# Patient Record
Sex: Female | Born: 1970 | Race: White | Hispanic: No | Marital: Married | Smoking: Never smoker
Health system: Southern US, Community
[De-identification: ages and names within clinical notes are randomized; demographics above are authoritative.]

---

## 1999-11-04 ENCOUNTER — Other Ambulatory Visit: Admission: RE | Admit: 1999-11-04 | Discharge: 1999-11-04 | Payer: Self-pay | Admitting: Obstetrics and Gynecology

## 2000-03-24 ENCOUNTER — Inpatient Hospital Stay (HOSPITAL_COMMUNITY): Admission: AD | Admit: 2000-03-24 | Discharge: 2000-03-24 | Payer: Self-pay | Admitting: Obstetrics & Gynecology

## 2000-04-26 ENCOUNTER — Encounter: Payer: Self-pay | Admitting: Obstetrics and Gynecology

## 2000-04-26 ENCOUNTER — Ambulatory Visit (HOSPITAL_COMMUNITY): Admission: RE | Admit: 2000-04-26 | Discharge: 2000-04-26 | Payer: Self-pay | Admitting: Obstetrics and Gynecology

## 2000-05-12 ENCOUNTER — Inpatient Hospital Stay (HOSPITAL_COMMUNITY): Admission: AD | Admit: 2000-05-12 | Discharge: 2000-05-14 | Payer: Self-pay | Admitting: Obstetrics and Gynecology

## 2000-06-14 ENCOUNTER — Other Ambulatory Visit: Admission: RE | Admit: 2000-06-14 | Discharge: 2000-06-14 | Payer: Self-pay | Admitting: Obstetrics and Gynecology

## 2003-11-04 ENCOUNTER — Other Ambulatory Visit: Admission: RE | Admit: 2003-11-04 | Discharge: 2003-11-04 | Payer: Self-pay | Admitting: Obstetrics and Gynecology

## 2004-11-08 ENCOUNTER — Other Ambulatory Visit: Admission: RE | Admit: 2004-11-08 | Discharge: 2004-11-08 | Payer: Self-pay | Admitting: Obstetrics and Gynecology

## 2005-12-02 ENCOUNTER — Other Ambulatory Visit: Admission: RE | Admit: 2005-12-02 | Discharge: 2005-12-02 | Payer: Self-pay | Admitting: Obstetrics and Gynecology

## 2007-01-09 ENCOUNTER — Other Ambulatory Visit: Admission: RE | Admit: 2007-01-09 | Discharge: 2007-01-09 | Payer: Self-pay | Admitting: Obstetrics and Gynecology

## 2008-04-07 ENCOUNTER — Other Ambulatory Visit: Admission: RE | Admit: 2008-04-07 | Discharge: 2008-04-07 | Payer: Self-pay | Admitting: Obstetrics and Gynecology

## 2009-04-08 ENCOUNTER — Other Ambulatory Visit: Admission: RE | Admit: 2009-04-08 | Discharge: 2009-04-08 | Payer: Self-pay | Admitting: Obstetrics and Gynecology

## 2013-12-22 ENCOUNTER — Ambulatory Visit (INDEPENDENT_AMBULATORY_CARE_PROVIDER_SITE_OTHER): Payer: BC Managed Care – PPO | Admitting: Internal Medicine

## 2013-12-22 VITALS — BP 114/68 | HR 73 | Temp 98.0°F | Resp 15 | Ht 65.5 in | Wt 131.5 lb

## 2013-12-22 DIAGNOSIS — B850 Pediculosis due to Pediculus humanus capitis: Secondary | ICD-10-CM

## 2013-12-22 MED ORDER — IVERMECTIN 3 MG PO TABS: 400.0000 ug/kg | ORAL_TABLET | Freq: Once | ORAL | Status: AC

## 2013-12-23 NOTE — Progress Notes (Signed)
Here with daughter and son who is also like her been through to topical treatments for head lice this summer with remission but then relapse. They have a third relapse over the last 2 days. 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 ivermectin (STROMECTOL) 3 MG TABS tablet    Sig: Take 8 tablets (24,000 mcg total) by mouth once. Repeat in 7 days    Dispense:  16 tablet    Refill:  0   She is given a handout regarding side effects which she can share with her family Husband is bald and does not need treatment

## 2014-05-30 ENCOUNTER — Ambulatory Visit
Admission: RE | Admit: 2014-05-30 | Discharge: 2014-05-30 | Disposition: A | Discharge status: Home / Self Care | Payer: BC Managed Care – PPO | Source: Ambulatory Visit | Attending: Family Medicine | Admitting: Family Medicine

## 2014-05-30 ENCOUNTER — Other Ambulatory Visit: Payer: Self-pay | Admitting: Family Medicine

## 2014-05-30 DIAGNOSIS — M545 Low back pain: Secondary | ICD-10-CM

## 2017-09-19 ENCOUNTER — Other Ambulatory Visit: Payer: Self-pay | Admitting: Family Medicine

## 2017-09-19 DIAGNOSIS — M5416 Radiculopathy, lumbar region: Secondary | ICD-10-CM

## 2017-09-20 ENCOUNTER — Other Ambulatory Visit: Payer: BC Managed Care – PPO

## 2017-09-21 ENCOUNTER — Ambulatory Visit
Admission: RE | Admit: 2017-09-21 | Discharge: 2017-09-21 | Disposition: A | Discharge status: Home / Self Care | Payer: BC Managed Care – PPO | Source: Ambulatory Visit | Attending: Family Medicine | Admitting: Family Medicine

## 2017-09-21 ENCOUNTER — Other Ambulatory Visit: Payer: Self-pay | Admitting: Family Medicine

## 2017-09-21 DIAGNOSIS — M5416 Radiculopathy, lumbar region: Secondary | ICD-10-CM

## 2017-10-19 ENCOUNTER — Encounter: Payer: Self-pay | Admitting: Family Medicine

## 2017-10-19 ENCOUNTER — Ambulatory Visit: Payer: BC Managed Care – PPO | Admitting: Family Medicine

## 2017-10-19 VITALS — BP 110/84 | HR 83 | Ht 65.5 in | Wt 131.0 lb

## 2017-10-19 DIAGNOSIS — G5702 Lesion of sciatic nerve, left lower limb: Secondary | ICD-10-CM | POA: Diagnosis not present

## 2017-10-19 DIAGNOSIS — M79605 Pain in left leg: Secondary | ICD-10-CM

## 2017-10-19 MED ORDER — GABAPENTIN 100 MG PO CAPS: 200.0000 mg | ORAL_CAPSULE | Freq: Every day | ORAL | 3 refills | Status: DC

## 2017-10-19 NOTE — Patient Instructions (Signed)
Good to see you  Ice 20 minutes 2 times daily. Usually after activity and before bed. Exercises 3 times a week.  Gabapentin 200mg  at night Tramadol 1/2 pill 2 times a day for a week then  1/2 pill at night for a week then discontinue  See me again in 4 weeks  Write me in 2 weeks

## 2017-10-19 NOTE — Progress Notes (Signed)
Tawana ScaleZach Smith D.O. [facility] [address] [place], [place] [address] Phone: [phone] Subjective:    I'm seeing this patient by the request  of:  Deatra JamesSun, Vyvyan, MD   CC: Low back pain  BJY:NWGNFAOZHYHPI:Subjective  Bailey Silva is a 47 y.o. female coming in with complaint of left leg pain. Started in the glut. And radiates to the foot. Numbness and tingling. States that her foot is numb a lot. Has xrays. Heel of foot also painful. States she has narrowing between L5-S1. Has a history of sciatica.   Onset- [facility]- mid. Glut. To the foot Duration-  Character- "felt like a knife" down her leg it feels like a tight muscle pain, feels like her entire leg and foot is cramping, walking, standing Aggravating factors-  Reliving factors-   Therapies tried- Medications, heat  Severity-7 out of 10      No past medical history on file. No past surgical history on file. Social History   Socioeconomic History  . Marital status: Married    Spouse name: Not on file  . Number of children: Not on file  . Years of education: Not on file  . Highest education level: Not on file  Occupational History  . Not on file  Social Needs  . Financial resource strain: Not on file  . Food insecurity:    Worry: Not on file    Inability: Not on file  . Transportation needs:    Medical: Not on file    Non-medical: Not on file  Tobacco Use  . Smoking status: Never Smoker  . Smokeless tobacco: Never Used  Substance and Sexual Activity  . Alcohol use: Yes    Alcohol/week: 3.0 oz    Types: 5 drink(s) per week  . Drug use: No  . Sexual activity: Not on file  Lifestyle  . Physical activity:    Days per week: Not on file    Minutes per session: Not on file  . Stress: Not on file  Relationships  . Social connections:    Talks on phone: Not on file    Gets together: Not on file    Attends religious service: Not on file    Active member of club or organization: Not on file    Attends  meetings of clubs or organizations: Not on file    Relationship status: Not on file  Other Topics Concern  . Not on file  Social History Narrative  . Not on file   No Known Allergies Family History  Problem Relation Age of Onset  . Cancer Mother   . Cancer Maternal Grandfather      Past medical history, social, surgical and family history all reviewed in electronic medical record.  No pertanent information unless stated regarding to the chief complaint.   Review of Systems:Review of systems updated and as accurate as of 10/19/17  No headache, visual changes, nausea, vomiting, diarrhea, constipation, dizziness, abdominal pain, skin rash, fevers, chills, night sweats, weight loss, swollen lymph nodes, body aches, joint swelling, muscle aches, chest pain, shortness of breath, mood changes.   Objective  Blood pressure 110/84, pulse 83, height 5' 5.5" (1.664 m), weight 131 lb (59.4 kg), SpO2 97 %. Systems examined below as of 10/19/17   General: No apparent distress alert and oriented x3 mood and affect normal, dressed appropriately.  HEENT: Pupils equal, extraocular movements intact  Respiratory: Patient's speak in full sentences and does not appear short of breath  Cardiovascular: No  lower extremity edema, non tender, no erythema  Skin: Warm dry intact with no signs of infection or rash on extremities or on axial skeleton.  Abdomen: Soft nontender  Neuro: Cranial nerves II through XII are intact, neurovascularly intact in all extremities with 2+ DTRs and 2+ pulses.  Lymph: No lymphadenopathy of posterior or anterior cervical chain or axillae bilaterally.  Gait normal with good balance and coordination.  MSK:  Non tender with full range of motion and good stability and symmetric strength and tone of shoulders, elbows, wrist, hip, knee and ankles bilaterally.   Back Exam:  Inspection: Very minimal loss of lordosis Motion: Flexion 45 deg, Extension 25 deg, Side Bending to 45 deg  bilaterally,  Rotation to 45 deg bilaterally  SLR laying: Mild positive XSLR laying: Negative  Palpable tenderness: Minimal tenderness over the piriformis on the left side. FABER: negative. Sensory change: Gross sensation intact to all lumbar and sacral dermatomes.  Reflexes: 2+ at both patellar tendons, 2+ at achilles tendons, Babinski's downgoing.  Strength at foot  Patient does have 4 out of 5 plantarflexion of the left foot compared to the right foot Quad: 5/5 Hamstring: 5/5 Hip flexor: 5/5 Hip abductors: 5/5  Gait unremarkable.  [id number]; 15 additional minutes spent for Therapeutic exercises as stated in above notes.  This included exercises focusing on stretching, strengthening, with significant focus on eccentric aspects.   Long term goals include an improvement in range of motion, strength, endurance as well as avoiding reinjury. Patient's frequency would include in 1-2 times a day, 3-5 times a week for a duration of 6-12 weeks. Piriformis Syndrome  Using an anatomical model, reviewed with the patient the structures involved and how they related to diagnosis. The patient indicated understanding.   The patient was given a handout from Dr. Ailene Ards book "The Sports Medicine Patient Advisor" describing the anatomy and rehabilitation of the following condition: Piriformis Syndrome  Also given a handout with more extensive Piriformis stretching, hip flexor and abductor strengthening, ham stretching  Rec deep massage, explained self-massage with ball  Proper technique shown and discussed handout in great detail with ATC.  All questions were discussed and answered.      Impression and Recommendations:     This case required medical decision making of moderate complexity.      Note: This dictation was prepared with Dragon dictation along with smaller phrase technology. Any transcriptional errors that result from this process are unintentional.

## 2017-10-19 NOTE — Assessment & Plan Note (Addendum)
Patient has more of the left piriformis syndrome.  I do think that this is causing some radicular symptoms.  Based on patient's most subjective information it does appear that patient could have had a herniated disc that could be slowly improving.  We discussed gabapentin and icing regimen.  We discussed home exercises and patient work with Event organiserathletic trainer.  We discussed avoiding certain activities at this moment.  Follow-up again in 4 to 8 weeks  Due to the mild weakness level of the foot as well as the longevity of these symptoms I do want to get an EMG for further evaluation.  Patient continues to have trouble fails greater than 6 weeks of conservative therapy I would recommend that possible advanced imaging would be warranted.

## 2017-10-20 ENCOUNTER — Telehealth: Payer: Self-pay | Admitting: Family Medicine

## 2017-10-20 ENCOUNTER — Encounter: Payer: Self-pay | Admitting: Family Medicine

## 2017-10-20 NOTE — Telephone Encounter (Signed)
Copied from CRM [id number]. Topic: Quick Communication - Rx Refill/Question >> Oct 20, 2017 12:55 PM Alexander BergeronBarksdale, Harvey B wrote: Pt called b/c she saw Dr. Katrinka BlazingSmith yesterday and was given Gabapentin but was well on the AVS it stated that she was given Tramadol but the medication was not sent to the pharmacy; pt is wondering if the medication is needed now; pt informed that Dr. Katrinka BlazingSmith is not in office, contact pt to advise

## 2017-10-21 NOTE — Telephone Encounter (Signed)
LOV with Dr. Katrinka BlazingSmith on 10/19/17. See request below. Pt also sent Mychart message on 6/28

## 2017-10-23 NOTE — Telephone Encounter (Signed)
She probably does not need the tramadol now which is good.

## 2017-10-23 NOTE — Telephone Encounter (Signed)
Dr Katrinka Blazingsmith advised pt via mychart.

## 2017-10-30 ENCOUNTER — Encounter: Payer: Self-pay | Admitting: Family Medicine

## 2017-11-01 ENCOUNTER — Encounter: Payer: Self-pay | Admitting: Family Medicine

## 2017-11-03 ENCOUNTER — Encounter: Payer: Self-pay | Admitting: Family Medicine

## 2017-11-10 ENCOUNTER — Other Ambulatory Visit: Payer: Self-pay | Admitting: Family Medicine

## 2017-11-10 NOTE — Telephone Encounter (Signed)
Refill done.  

## 2017-11-14 ENCOUNTER — Encounter: Payer: Self-pay | Admitting: Family Medicine

## 2017-11-14 ENCOUNTER — Encounter

## 2017-11-14 ENCOUNTER — Ambulatory Visit: Payer: BC Managed Care – PPO | Admitting: Family Medicine

## 2017-11-14 ENCOUNTER — Ambulatory Visit: Payer: Self-pay

## 2017-11-14 VITALS — BP 110/72 | HR 80 | Ht 65.5 in | Wt 131.0 lb

## 2017-11-14 DIAGNOSIS — G5702 Lesion of sciatic nerve, left lower limb: Secondary | ICD-10-CM

## 2017-11-14 DIAGNOSIS — M79605 Pain in left leg: Secondary | ICD-10-CM

## 2017-11-14 MED ORDER — TRAZODONE HCL 50 MG PO TABS: 25.0000 mg | ORAL_TABLET | Freq: Every evening | ORAL | 3 refills | Status: DC | PRN

## 2017-11-14 NOTE — Progress Notes (Signed)
Bailey Silva D.O. [facility] [address] [place], [place] [address] Phone: [phone] Subjective:     CC: Left leg pain  BJY:NWGNFAOZHYHPI:Subjective  Bailey Silva is a 47 y.o. female coming in with complaint of left leg pain. She continues to have left foot pain that is lateral but seems to move into the plantar fascia at times. Is taking gabapentin at night. Is unsure if the medication is working.  Patient states that still has the account of the radicular symptoms going down the leg.  Been doing the exercises occasionally.  Continues to have increasing stress in her life that seems to be contributing to not being able to be very compliant with the treatment options at this time    No past medical history on file. No past surgical history on file. Social History   Socioeconomic History  . Marital status: Married    Spouse name: Not on file  . Number of children: Not on file  . Years of education: Not on file  . Highest education level: Not on file  Occupational History  . Not on file  Social Needs  . Financial resource strain: Not on file  . Food insecurity:    Worry: Not on file    Inability: Not on file  . Transportation needs:    Medical: Not on file    Non-medical: Not on file  Tobacco Use  . Smoking status: Never Smoker  . Smokeless tobacco: Never Used  Substance and Sexual Activity  . Alcohol use: Yes    Alcohol/week: 3.0 oz    Types: 5 drink(s) per week  . Drug use: No  . Sexual activity: Not on file  Lifestyle  . Physical activity:    Days per week: Not on file    Minutes per session: Not on file  . Stress: Not on file  Relationships  . Social connections:    Talks on phone: Not on file    Gets together: Not on file    Attends religious service: Not on file    Active member of club or organization: Not on file    Attends meetings of clubs or organizations: Not on file    Relationship status: Not on file  Other Topics Concern  . Not on file  Social  History Narrative  . Not on file   No Known Allergies Family History  Problem Relation Age of Onset  . Cancer Mother   . Cancer Maternal Grandfather      Past medical history, social, surgical and family history all reviewed in electronic medical record.  No pertanent information unless stated regarding to the chief complaint.   Review of Systems:Review of systems updated and as accurate as of 11/14/17  No headache, visual changes, nausea, vomiting, diarrhea, constipation, dizziness, abdominal pain, skin rash, fevers, chills, night sweats, weight loss, swollen lymph nodes, body aches, joint swelling, muscle aches, chest pain, shortness of breath, mood changes.   Objective  Blood pressure 110/72, pulse 80, height 5' 5.5" (1.664 m), weight 131 lb (59.4 kg), SpO2 98 %. Systems examined below as of 11/14/17   General: No apparent distress alert and oriented x3 mood and affect normal, dressed appropriately.  HEENT: Pupils equal, extraocular movements intact  Respiratory: Patient's speak in full sentences and does not appear short of breath  Cardiovascular: No lower extremity edema, non tender, no erythema  Skin: Warm dry intact with no signs of infection or rash on extremities or on axial skeleton.  Abdomen: Soft nontender  Neuro: Cranial nerves II through XII are intact, neurovascularly intact in all extremities with 2+ DTRs and 2+ pulses.  Lymph: No lymphadenopathy of posterior or anterior cervical chain or axillae bilaterally.  Gait normal with good balance and coordination.  MSK:  Non tender with full range of motion and good stability and symmetric strength and tone of shoulders, elbows, wrist, hip, knee and ankles bilaterally.  Back Exam:  Inspection: Unremarkable  Motion: Flexion 40 deg, Extension 25 deg, Side Bending to 35 deg bilaterally,  Rotation to 35 deg bilaterally  SLR laying: Negative  XSLR laying: Negative  Palpable tenderness: Tender to palpation the left  piriformis. FABER: Positive left. Sensory change: Gross sensation intact to all lumbar and sacral dermatomes.  Reflexes: 2+ at both patellar tendons, 2+ at achilles tendons, Babinski's downgoing.  Strength at foot  Plantar-flexion: 5/5 Dorsi-flexion: 5/5 Eversion: 5/5 Inversion: 5/5  Leg strength  Quad: 5/5 Hamstring: 5/5 Hip flexor: 5/5 Hip abductors: 4/5  Gait unremarkable.  Procedure: Real-time Ultrasound Guided Injection of left piriformis tendon sheath Device: GE Logiq Q7 Ultrasound guided injection is preferred based studies that show increased duration, increased effect, greater accuracy, decreased procedural pain, increased response rate, and decreased cost with ultrasound guided versus blind injection.  Verbal informed consent obtained.  Time-out conducted.  Noted no overlying erythema, induration, or other signs of local infection.  Skin prepped in a sterile fashion.  Local anesthesia: Topical Ethyl chloride.  With sterile technique and under real time ultrasound guidance: 21-gauge 2 inch needle patient was injected with a total of 1 cc of 0.5% Marcaine and 1 cc of Kenalog 40 mg/mL into the left Completed without difficulty  Pain immediately resolved suggesting accurate placement of the medication.  Advised to call if fevers/chills, erythema, induration, drainage, or persistent bleeding.  Images permanently stored and available for review in the ultrasound unit.  Impression: Technically successful ultrasound guided injection.   Impression and Recommendations:     This case required medical decision making of moderate complexity.      Note: This dictation was prepared with Dragon dictation along with smaller phrase technology. Any transcriptional errors that result from this process are unintentional.

## 2017-11-14 NOTE — Assessment & Plan Note (Signed)
Given injection and tolerated the procedure well.  He seemed to do well.  Continue all other conservative therapy.  Patient declined formal physical therapy.  Discussed icing regimen.  Follow-up again in 4 to 6 weeks

## 2017-11-14 NOTE — Patient Instructions (Signed)
Good to see you  Continue the gabapentin  Trazadone also at night if really needed Tried piriformis injection today and I hope it helps See me again in 4-6 weeks

## 2017-11-16 ENCOUNTER — Ambulatory Visit: Payer: BC Managed Care – PPO | Admitting: Family Medicine

## 2017-12-02 ENCOUNTER — Encounter: Payer: Self-pay | Admitting: Family Medicine

## 2017-12-05 ENCOUNTER — Encounter: Payer: Self-pay | Admitting: Family Medicine

## 2017-12-05 DIAGNOSIS — G5702 Lesion of sciatic nerve, left lower limb: Secondary | ICD-10-CM

## 2017-12-05 NOTE — Progress Notes (Deleted)
Bailey ScaleZach Jessi Silva D.O. [facility] [address] [place], [place] [address] Phone: [phone] Subjective:    I'm seeing this patient by the request  of:    CC:   NGE:XBMWUXLKGMHPI:Subjective  Bailey MinkKristie Silva is a 47 y.o. female coming in with complaint of ***  Onset-  Location Duration-  Character- Aggravating factors- Reliving factors-  Therapies tried-  Severity-     No past medical history on file. No past surgical history on file. Social History   Socioeconomic History  . Marital status: Married    Spouse name: Not on file  . Number of children: Not on file  . Years of education: Not on file  . Highest education level: Not on file  Occupational History  . Not on file  Social Needs  . Financial resource strain: Not on file  . Food insecurity:    Worry: Not on file    Inability: Not on file  . Transportation needs:    Medical: Not on file    Non-medical: Not on file  Tobacco Use  . Smoking status: Never Smoker  . Smokeless tobacco: Never Used  Substance and Sexual Activity  . Alcohol use: Yes    Alcohol/week: 5.0 standard drinks    Types: 5 drink(s) per week  . Drug use: No  . Sexual activity: Not on file  Lifestyle  . Physical activity:    Days per week: Not on file    Minutes per session: Not on file  . Stress: Not on file  Relationships  . Social connections:    Talks on phone: Not on file    Gets together: Not on file    Attends religious service: Not on file    Active member of club or organization: Not on file    Attends meetings of clubs or organizations: Not on file    Relationship status: Not on file  Other Topics Concern  . Not on file  Social History Narrative  . Not on file   No Known Allergies Family History  Problem Relation Age of Onset  . Cancer Mother   . Cancer Maternal Grandfather      Past medical history, social, surgical and family history all reviewed in electronic medical record.  No pertanent information unless stated  regarding to the chief complaint.   Review of Systems:Review of systems updated and as accurate as of 12/05/17  No headache, visual changes, nausea, vomiting, diarrhea, constipation, dizziness, abdominal pain, skin rash, fevers, chills, night sweats, weight loss, swollen lymph nodes, body aches, joint swelling, muscle aches, chest pain, shortness of breath, mood changes.   Objective  There were no vitals taken for this visit. Systems examined below as of 12/05/17   General: No apparent distress alert and oriented x3 mood and affect normal, dressed appropriately.  HEENT: Pupils equal, extraocular movements intact  Respiratory: Patient's speak in full sentences and does not appear short of breath  Cardiovascular: No lower extremity edema, non tender, no erythema  Skin: Warm dry intact with no signs of infection or rash on extremities or on axial skeleton.  Abdomen: Soft nontender  Neuro: Cranial nerves II through XII are intact, neurovascularly intact in all extremities with 2+ DTRs and 2+ pulses.  Lymph: No lymphadenopathy of posterior or anterior cervical chain or axillae bilaterally.  Gait normal with good balance and coordination.  MSK:  Non tender with full range of motion and good stability and symmetric strength and tone of shoulders, elbows, wrist, hip, knee  and ankles bilaterally.     Impression and Recommendations:     This case required medical decision making of moderate complexity.      Note: This dictation was prepared with Dragon dictation along with smaller phrase technology. Any transcriptional errors that result from this process are unintentional.

## 2017-12-06 ENCOUNTER — Ambulatory Visit: Payer: BC Managed Care – PPO | Admitting: Family Medicine

## 2017-12-06 ENCOUNTER — Encounter: Payer: Self-pay | Admitting: Neurology

## 2017-12-06 ENCOUNTER — Other Ambulatory Visit: Payer: Self-pay | Admitting: Family Medicine

## 2017-12-06 NOTE — Telephone Encounter (Signed)
Refill done.  

## 2017-12-07 ENCOUNTER — Other Ambulatory Visit: Payer: Self-pay | Admitting: *Deleted

## 2017-12-07 DIAGNOSIS — G5702 Lesion of sciatic nerve, left lower limb: Secondary | ICD-10-CM

## 2017-12-28 ENCOUNTER — Ambulatory Visit (INDEPENDENT_AMBULATORY_CARE_PROVIDER_SITE_OTHER): Payer: BC Managed Care – PPO | Admitting: Neurology

## 2017-12-28 DIAGNOSIS — G5702 Lesion of sciatic nerve, left lower limb: Secondary | ICD-10-CM

## 2017-12-28 DIAGNOSIS — M5417 Radiculopathy, lumbosacral region: Secondary | ICD-10-CM

## 2017-12-28 NOTE — Procedures (Signed)
[facility]  [address] [place], [address]  [place], [place] [address] Tel: [phone] Fax:  [phone] Test Date:  12/28/2017  Patient: Bailey Silva DOB: 1970-10-06 Physician: Nita Sickle, DO  Sex: Female Height: 5' " Ref Phys: Antoine Primas, DO  ID#: [id number] Temp: 34.2C Technician:    Patient Complaints: This is a 47 year old female referred for evaluation of left leg pain and paresthesias.  NCV & EMG Findings: Extensive electrodiagnostic testing of the left lower extremity shows:  1. Left sural and superficial peroneal sensory responses are within normal limits. 2. Left peroneal motor response shows reduced amplitude distally and greater proximal response. Further, there is a motor response when stimulating at the medial malleolus and recording at the extensor digitorum brevis; these findings are consistent with an accessory peroneal nerve, normal variant. Left tibial motor responses within normal limits. 3. Left tibial H reflex study is within normal limits. 4. Rapid recruitment pattern is seen in the biceps femoris and medial gastrocnemius muscles, with active denervation isolated to the latter.    Impression: 1. Subacute S1 radiculopathy affecting the left lower extremity, mild in degree electrically. 2. 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   ___________________________ Nita Sickle, DO    Nerve Conduction Studies Anti Sensory Summary Table   Site NR Peak (ms) Norm Peak (ms) P-T Amp (V) Norm P-T Amp  Left Sup Peroneal Anti Sensory (Ant Lat Mall)  12 cm    2.3 <4.5 8.1 >5  Left Sural Anti Sensory (Lat Mall)  Calf    2.6 <4.5 8.9 >5   Motor Summary Table   Site NR Onset (ms) Norm Onset (ms) O-P Amp (mV) Norm O-P Amp Site1 Site2 Delta-0 (ms) Dist (cm) Vel (m/s) Norm Vel (m/s)  Left Peroneal Motor (Ext Dig Brev)  Ankle    3.2 <5.5 2.8 >3 B Fib Ankle 6.7 36.0 54 >40  B Fib    9.9  3.1  Poplt B Fib 0.6 9.0 150 >40    Poplt    10.5  3.1         Medial malleolus    3.9  11.5         Left Tibial Motor (Abd Hall Brev)  Ankle    4.4 <6.0 16.6 >8 Knee Ankle 7.4 39.0 53 >40  Knee    11.8  12.4          H Reflex Studies   NR H-Lat (ms) Lat Norm (ms) L-R H-Lat (ms)  Left Tibial (Gastroc)     30.88 <35    EMG   Side Muscle Ins Act Fibs Psw Fasc Number Recrt Dur Dur. Amp Amp. Poly Poly. Comment  Left AntTibialis Nml Nml Nml Nml Nml Nml Nml Nml Nml Nml Nml Nml N/A  Left Gastroc Nml 1+ Nml Nml 1- Rapid Few 1+ Nml Nml Few 1+ N/A  Left Flex Dig Long Nml Nml Nml Nml Nml Nml Nml Nml Nml Nml Nml Nml N/A  Left RectFemoris Nml Nml Nml Nml Nml Nml Nml Nml Nml Nml Nml Nml N/A  Left GluteusMed Nml Nml Nml Nml Nml Nml Nml Nml Nml Nml Nml Nml N/A  Left Lumbo Parasp Low Nml Nml Nml Nml Nml Nml Nml Nml Nml Nml Nml Nml N/A  Left BicepsFemS Nml Nml Nml Nml 1- Rapid Few 1+ Nml Nml Few 1+ N/A  Right Gastroc Nml Nml Nml Nml Nml Nml Nml Nml Nml Nml Nml Nml N/A      Waveforms:

## 2018-03-15 ENCOUNTER — Other Ambulatory Visit: Payer: Self-pay

## 2018-03-15 ENCOUNTER — Other Ambulatory Visit: Payer: Self-pay | Admitting: Family Medicine

## 2018-03-15 MED ORDER — TRAZODONE HCL 50 MG PO TABS: 25.0000 mg | ORAL_TABLET | Freq: Every evening | ORAL | 0 refills | Status: AC | PRN

## 2018-06-12 IMAGING — CR DG LUMBAR SPINE 2-3V
3 series · 3 of 3 positions shown · non-contrast
Comparison: 05/30/2014

CLINICAL DATA: Low back pain radiating into the left leg

EXAM:
LUMBAR SPINE - 2-3 VIEW

[w lumbar spine ap]
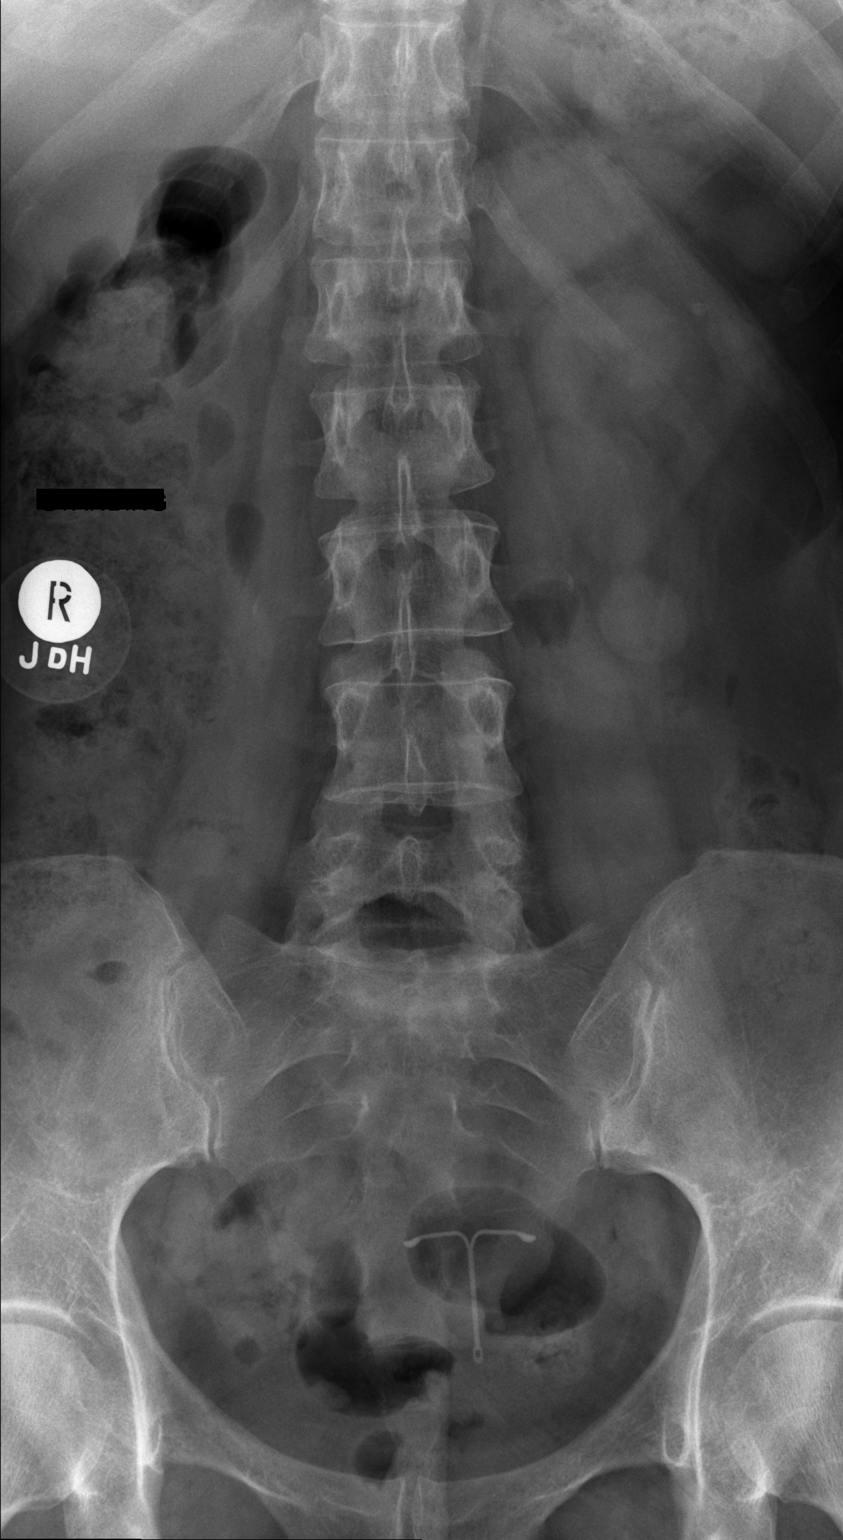

[w lumbar spine lat]
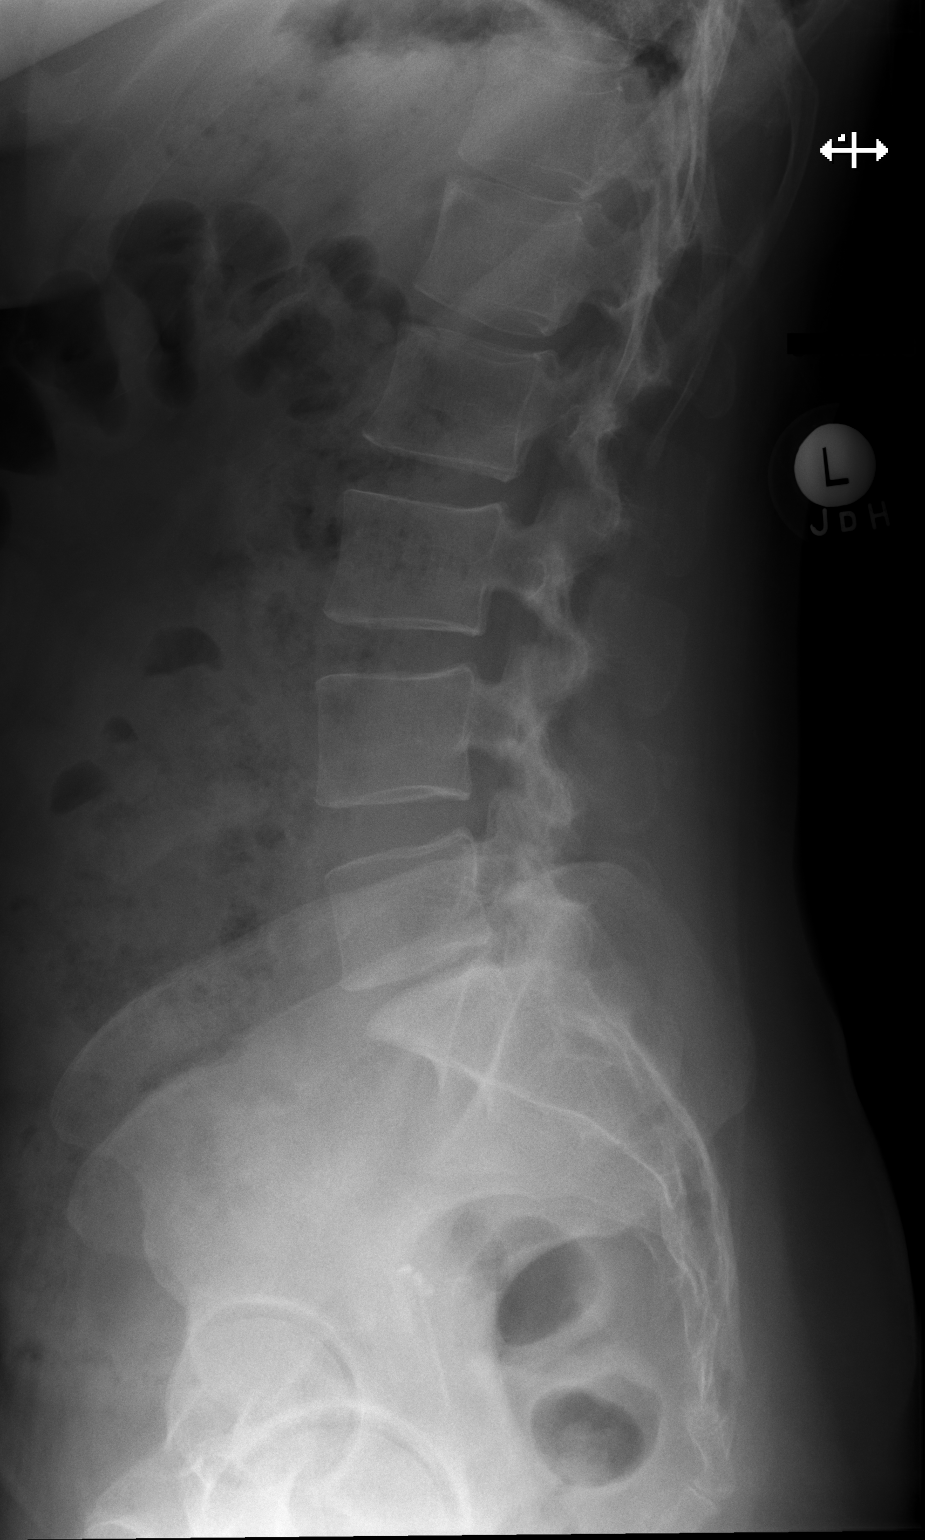

[w lumbar l-5 s-1 spot]
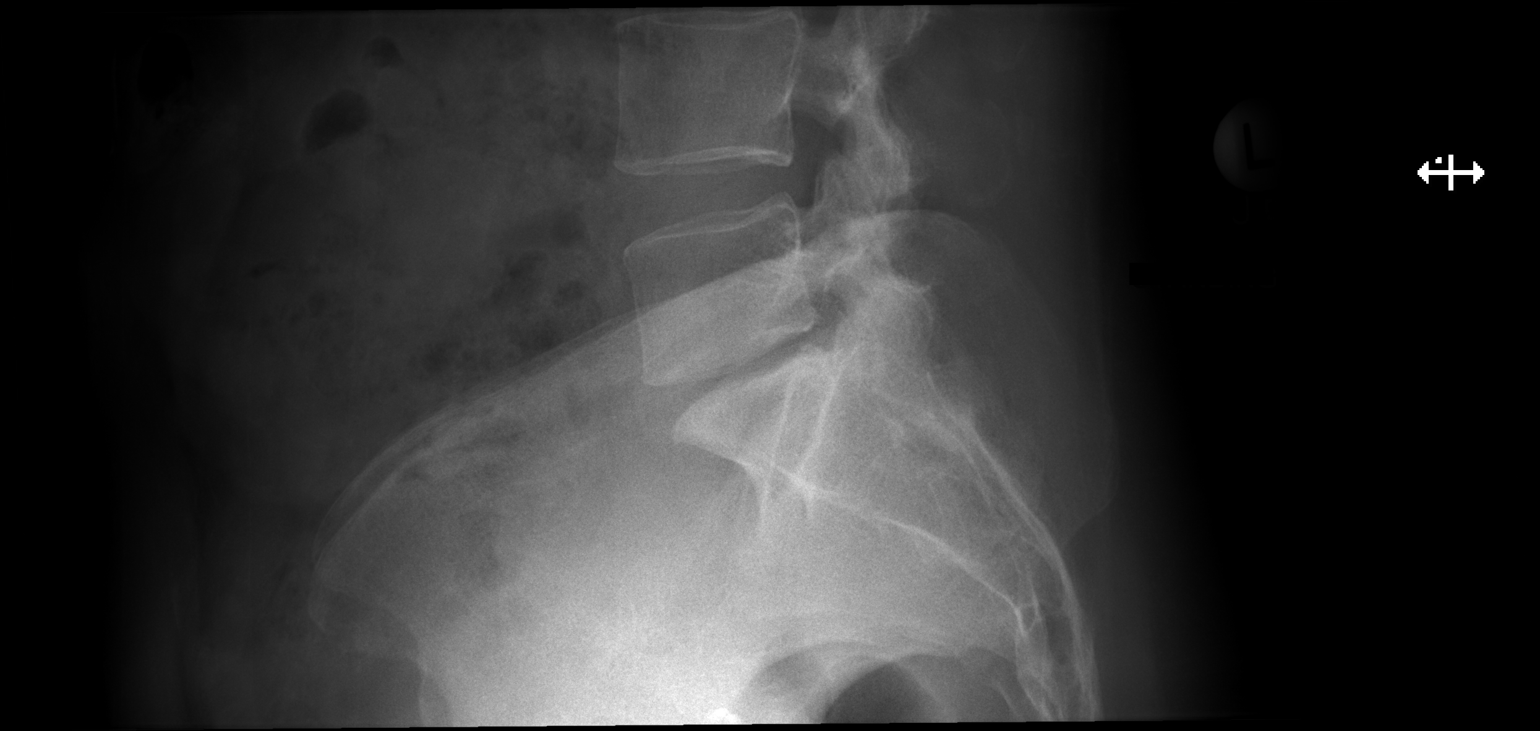

[3 of 3 positions shown; findings below may reference images not displayed]

FINDINGS: Five lumbar type vertebral bodies are well visualized. Vertebral
body height is well maintained. Mild disc space narrowing is noted
at L5-S1 slightly progressed when compare with the prior exam. No
soft tissue abnormality is seen. An IUD is noted in the pelvis.
IMPRESSION: Mild degenerative change without acute abnormality.

## 2018-07-21 ENCOUNTER — Other Ambulatory Visit: Payer: Self-pay | Admitting: Family Medicine

## 2018-07-24 NOTE — Telephone Encounter (Signed)
Left message to call back for a virtual visit in order to refill. 

## 2018-07-25 NOTE — Telephone Encounter (Signed)
Left message to call back for virtual visit.
